# Patient Record
Sex: Female | Born: 1980 | Race: White | Hispanic: No | Marital: Married
Health system: Southern US, Community
[De-identification: ages and names within clinical notes are randomized; demographics above are authoritative.]

---

## 2001-08-16 ENCOUNTER — Other Ambulatory Visit: Admission: RE | Admit: 2001-08-16 | Discharge: 2001-08-16 | Payer: Self-pay | Admitting: Gynecology

## 2002-08-12 ENCOUNTER — Emergency Department (HOSPITAL_COMMUNITY): Admission: EM | Admit: 2002-08-12 | Discharge: 2002-08-12 | Payer: Self-pay | Admitting: Emergency Medicine

## 2002-08-12 ENCOUNTER — Encounter: Payer: Self-pay | Admitting: Emergency Medicine

## 2002-09-13 ENCOUNTER — Other Ambulatory Visit: Admission: RE | Admit: 2002-09-13 | Discharge: 2002-09-13 | Payer: Self-pay | Admitting: Gynecology

## 2004-10-21 ENCOUNTER — Other Ambulatory Visit: Admission: RE | Admit: 2004-10-21 | Discharge: 2004-10-21 | Payer: Self-pay | Admitting: Gynecology

## 2008-08-15 ENCOUNTER — Ambulatory Visit (HOSPITAL_COMMUNITY): Admission: RE | Admit: 2008-08-15 | Discharge: 2008-08-15 | Payer: Self-pay | Admitting: Obstetrics

## 2008-08-15 ENCOUNTER — Observation Stay (HOSPITAL_COMMUNITY): Admission: AD | Admit: 2008-08-15 | Discharge: 2008-08-15 | Payer: Self-pay | Admitting: Obstetrics

## 2008-08-15 ENCOUNTER — Encounter: Admission: RE | Admit: 2008-08-15 | Discharge: 2008-08-15 | Payer: Self-pay | Admitting: Obstetrics

## 2008-09-23 ENCOUNTER — Inpatient Hospital Stay (HOSPITAL_COMMUNITY): Admission: RE | Admit: 2008-09-23 | Discharge: 2008-09-27 | Payer: Self-pay | Admitting: Obstetrics

## 2008-09-24 ENCOUNTER — Encounter (INDEPENDENT_AMBULATORY_CARE_PROVIDER_SITE_OTHER): Payer: Self-pay | Admitting: Obstetrics

## 2010-10-14 LAB — CBC
HCT: 25.1 % — ABNORMAL LOW (ref 36.0–46.0)
HCT: 35.2 % — ABNORMAL LOW (ref 36.0–46.0)
Hemoglobin: 12 g/dL (ref 12.0–15.0)
Hemoglobin: 8.6 g/dL — ABNORMAL LOW (ref 12.0–15.0)
MCHC: 34.2 g/dL (ref 30.0–36.0)
MCV: 93.1 fL (ref 78.0–100.0)
Platelets: 270 10*3/uL (ref 150–400)
RBC: 2.66 MIL/uL — ABNORMAL LOW (ref 3.87–5.11)
RBC: 3.78 MIL/uL — ABNORMAL LOW (ref 3.87–5.11)
RDW: 13.8 % (ref 11.5–15.5)
WBC: 13.7 10*3/uL — ABNORMAL HIGH (ref 4.0–10.5)
WBC: 14.6 10*3/uL — ABNORMAL HIGH (ref 4.0–10.5)

## 2010-10-14 LAB — RPR: RPR Ser Ql: NONREACTIVE

## 2010-10-19 LAB — DIFFERENTIAL
Basophils Absolute: 0 10*3/uL (ref 0.0–0.1)
Basophils Relative: 0 % (ref 0–1)
Monocytes Relative: 7 % (ref 3–12)
Neutro Abs: 12.8 10*3/uL — ABNORMAL HIGH (ref 1.7–7.7)
Neutrophils Relative %: 77 % (ref 43–77)

## 2010-10-19 LAB — COMPREHENSIVE METABOLIC PANEL
Alkaline Phosphatase: 68 U/L (ref 39–117)
BUN: 5 mg/dL — ABNORMAL LOW (ref 6–23)
Calcium: 9 mg/dL (ref 8.4–10.5)
Creatinine, Ser: 0.51 mg/dL (ref 0.4–1.2)
Glucose, Bld: 105 mg/dL — ABNORMAL HIGH (ref 70–99)
Potassium: 3.7 mEq/L (ref 3.5–5.1)
Total Protein: 6.3 g/dL (ref 6.0–8.3)

## 2010-10-19 LAB — STREP B DNA PROBE: Strep Group B Ag: NEGATIVE

## 2010-10-19 LAB — CBC
HCT: 33.2 % — ABNORMAL LOW (ref 36.0–46.0)
Hemoglobin: 11.3 g/dL — ABNORMAL LOW (ref 12.0–15.0)
MCHC: 34.2 g/dL (ref 30.0–36.0)
MCV: 93.2 fL (ref 78.0–100.0)
RDW: 13 % (ref 11.5–15.5)

## 2010-11-16 NOTE — Op Note (Signed)
Mary Combs, Mary Combs                ACCOUNT NO.:  [id number]   MEDICAL RECORD NO.:  [id number]          PATIENT TYPE:  INP   LOCATION:  9127                          FACILITY:  [facility]   PHYSICIAN:  Lendon Colonel, MD   DATE OF BIRTH:  07-21-1980   DATE OF PROCEDURE:  09/24/2008  DATE OF DISCHARGE:                               OPERATIVE REPORT   PREOPERATIVE DIAGNOSIS:  Fetal bradycardia.   POSTOPERATIVE DIAGNOSIS:  Fetal bradycardia.   PROCEDURE:  Urgent primary low-transverse cesarean section.   SURGEON:  Lendon Colonel, MD   ASSISTANT:  Maxie Better, MD   ANESTHESIA:  Epidural.   FINDINGS:  Female infant in the direct LOA position, weight 10 pounds 2  ounces, Apgars 6 and 9, cord pH 7.32.  Normal uterus, normal tubes,  normal ovaries.   SPECIMENS:  Placenta to Pathology.   ANTIBIOTICS:  Ancef 2 g.   ESTIMATED BLOOD LOSS:  800 mL.   COMPLICATIONS:  None.   INDICATIONS:  This is a 30 year old G1 admitted the day prior to  cesarean section for induction of labor for LGA and maternal discomfort.  The patient was admitted with the cervix of 3 cm, 50% effaced,  Unfavorable Bishop, she received Pitocin for about 12 hours.  The  patient was rested overnight with Cytotec, and the patient had AROM and  resumption of Pitocin at 4 in the a.m. on September 24, 2008.  The patient  had slow progression to about 5 cm, achieved active labor, and had an  epidural.  The patient overall reactive testing when suddenly exhibited  an 8-minute fetal bradycardia to the 60s.  Terbutaline was given and as  the fetal heart beat did not improve, the patient was taken to the OR  for an urgent primary cesarean section.   PROCEDURE:  After informed consent was obtained from the patient, the  patient was taken to the operating room, epidural anesthesia was found  to be adequate.  She was prepped with Betadine, she was draped.  A  Pfannenstiel skin incision was made 2 cm above the pubic symphysis  with  the scalpel, carried through to the underlying layer of fascia with the  scalpel.  The fascia was incised with the scalpel extension bluntly.  Kocher clamps were placed on the inferior aspect of the fascial  incision, elevated up.  Rectus muscles dissected off with the Mayo  scissors.  The superior aspect of the fascial incision was grasped with  the Kocher clamps, elevated up, and the underlying rectus muscles  dissected off sharply with the Mayo scissors.  The peritoneum was  identified and entered bluntly after the rectus muscles were separated  in the midline bluntly.  The peritoneal incision was extended bluntly.  Bladder blade was inserted.  Vesicouterine peritoneum was identified,  grasped with pickups, and entered sharply with Metzenbaum scissors.  The  bladder flap was created digitally.  The bladder blade was reinserted.  The lower uterine segment was incised in a transverse fashion with the  scalpel and extended bluntly.  The infant's occiput was easily brought  to  the incision, presenting nuchal cord was noted, and pushed back into  the uterus.  Approximately 2 minutes were spent in delivering the  infant's occiput, due to large size, some difficulty was had.  Vacuum  was placed in the infant's occiput about 3 times; however, poor suction  was able to be obtained secondary to increased uterine blood.   Eventually, the infant's head was delivered with fundal pressure and  guidance from abdominal hand.  The remainder of the infant was delivered  without complication.  Cord was clamped and cut.  Infant's was handed  off to the pediatrician with good cry.  Cord gas was obtained.  The  placenta was expressed.  The uterus was exteriorized and cleared off all  clots and debris.  Pitocin was given for hemostasis.  Uterine incision  was repaired with 0-Vicryl in a running locked fashion and a second  layer of same suture was used in the imbricating fashion to obtain good   hemostasis.  Good tone was noted.  Good hemostasis noted.  The uterus  was returned to the abdomen.  The gutters were cleared of all clots and  debris.  The pelvis was irrigated with warm normal saline.  The uterine  incisions were re-inspected and found to be hemostatic.  The peritoneum  was closed with 2-0 Vicryl in a running fashion.  The muscle edges and  underside of fascia were inspected, found to be hemostatic.  The fascia  was closed with 0-Vicryl in a running fashion in a single layer after  single suture of 2-0 Vicryl was used to reapproximate the rectus muscles  in the midline.  The subcutaneous tissue was closed with plain gut.  Several small capillary bleeders were Bovie cauterized for hemostasis,  and the skin was closed with staples.  The patient tolerated the  procedure well.  Sponge, lap, and needle counts were correct x3, and the  patient was taken to the recovery room in a stable condition.      Lendon Colonel, MD  Electronically Signed     KAF/MEDQ  D:  09/24/2008  T:  09/25/2008  Job:  [id number]

## 2010-11-19 NOTE — Discharge Summary (Signed)
Mary Combs, Mary Combs                ACCOUNT NO.:  [id number]   MEDICAL RECORD NO.:  [id number]          PATIENT TYPE:  INP   LOCATION:  9127                          FACILITY:  [facility]   PHYSICIAN:  Genia Del, M.D.DATE OF BIRTH:  September 03, 1980   DATE OF ADMISSION:  09/23/2008  DATE OF DISCHARGE:  09/27/2008                               DISCHARGE SUMMARY   DIAGNOSES:  39 weeks and 5 days gestation, large for gestational age,  estimated over 3900 grams, maternal hydronephrosis.   DISCHARGE DIAGNOSES:  On postoperative day #3, status post cesarean  section of a female newborn, acute blood loss anemia.   PATIENT HISTORY:  The patient is a gravida 1, para 0, at 66 weeks and  5/7.  Prenatal care has been at [facility] with the  primary of Dr. Ernestina Penna since 7 weeks' gestation.   PRENATAL LABORATORY STUDY:  The patient is A positive, antibody screen  negative, HIV negative, RPR is negative, hepatitis B is negative, GTT  within normal limits, and group beta strep negative.  Prenatal course  has been complicated by maternal hydronephrosis, hypothyroidism, and  size greater than dates consistent with LGA placenta.   MEDICAL HISTORY:  The patient has migraines, hypothyroidism.   SURGICAL HISTORY:  Bowel resection as an infant.   FAMILY HISTORY:  Myocardial infarction and diabetes.   ALLERGIES:  The patient has no known drug allergies.   CURRENT MEDICATIONS ON DAY OF ADMISSION:  Prenatal vitamin once daily  and Synthroid 50 mcg daily.   The patient presents for induction of labor for LGA with estimated fetal  weight of over 3900 g.  The patient underwent Cytotec cervical ripening  followed by Pitocin low-dose protocol.  Complications of labor  prolonged, fetal bradycardia on March 24 resulting in a cesarean section  with Dr. Ernestina Penna with a female newborn.   POSTOPERATIVE COURSE:  CBC postoperatively; white blood cell count 14.6,  hemoglobin 8.6, hematocrit 25.1, and  platelet count of 190.  The patient  was started on Niferex 150 mg one daily and Colace 100 mg one p.o. daily  for acute blood loss anemia.  The patient was discharged on day 3 with a  normal physical exam.  Incision is healing well and well-approximated  staples were removed and Steri-Strips were applied.  Vital signs on day  of discharge, 97.6.  Pulse 90, respirations 18, blood pressure 102/65.  The patient has remained afebrile during postoperative course.   DISCHARGE AT INSTRUCTIONS:  Per [facility] discharge booklet for  postoperative care.   MEDICATIONS AT DISCHARGE:  1. Prenatal vitamin once daily.  2. Colace 100 mg 1-2 p.o. daily p.r.n., avoid constipation.  3. Synthroid 50 mcg 1 p.o. daily.  4. Niferex 150 mg 1 p.o. daily.  5. Ibuprofen over-the-counter 2-4 tablets every 8 hours as needed for      discomfort.  6. Percocet 1 tablet p.o. q.4-6 h. as needed for pain.   The patient is to follow up at [facility] in 6 weeks.       Marlinda Mike, C.N.M.  Genia Del, M.D.  Electronically Signed    TB/MEDQ  D:  09/28/2008  T:  09/28/2008  Job:  [id number]

## 2011-06-01 ENCOUNTER — Other Ambulatory Visit: Payer: Self-pay | Admitting: Family Medicine

## 2011-06-01 DIAGNOSIS — R1032 Left lower quadrant pain: Secondary | ICD-10-CM

## 2011-06-03 ENCOUNTER — Other Ambulatory Visit: Payer: Self-pay

## 2011-08-02 ENCOUNTER — Inpatient Hospital Stay: Admission: RE | Admit: 2011-08-02 | Payer: Self-pay | Source: Ambulatory Visit

## 2016-03-23 ENCOUNTER — Ambulatory Visit: Payer: Self-pay | Admitting: Family Medicine

## 2016-08-01 ENCOUNTER — Ambulatory Visit: Payer: Self-pay | Admitting: Family Medicine

## 2016-12-01 ENCOUNTER — Telehealth: Payer: Self-pay | Admitting: *Deleted

## 2016-12-01 NOTE — Telephone Encounter (Signed)
Appt has been cancelled and will not be rescheduled at this time per KT.

## 2016-12-01 NOTE — Telephone Encounter (Signed)
Patient called office to r/s appt.  I advised her we are unable to reschedule her appt due to multiple cancellations.  Patient asked for my name and states she will be filing a formal complaint with [facility] regarding this matter.

## 2016-12-01 NOTE — Telephone Encounter (Signed)
-----   Message from Sheliah HatchKatherine E Tabori, MD sent at 12/01/2016 12:15 PM EDT ----- Regarding: RE: New patient appt Please do not reschedule pt.  3 strikes- you're out (of luck!)  KT ----- Message ----- From: Donzetta MattersJeffries, Angela H Sent: 12/01/2016  11:49 AM To: Sheliah HatchKatherine E Tabori, MD Subject: New patient appt                               Pt LMOVM stating that she will need to reschedule her NP appt for 12/02/16. When looking in her chart, this will make her 3rd NP rescheduled appt. Please advise if I should reschedule this again.

## 2016-12-01 NOTE — Telephone Encounter (Signed)
I am forwarding this note to management as there is no reason to file a complaint against you as you were simply relaying a message from me and enforcing our office policy.  She was given 3 chances to establish and often, people only get 1 opportunity.

## 2016-12-02 ENCOUNTER — Ambulatory Visit: Payer: Self-pay | Admitting: Family Medicine

## 2019-09-24 ENCOUNTER — Other Ambulatory Visit: Payer: Self-pay | Admitting: Obstetrics

## 2019-09-24 DIAGNOSIS — N6310 Unspecified lump in the right breast, unspecified quadrant: Secondary | ICD-10-CM

## 2019-09-24 DIAGNOSIS — R928 Other abnormal and inconclusive findings on diagnostic imaging of breast: Secondary | ICD-10-CM

## 2019-09-25 ENCOUNTER — Other Ambulatory Visit: Payer: Self-pay

## 2019-09-25 ENCOUNTER — Other Ambulatory Visit: Payer: Self-pay | Admitting: Obstetrics

## 2019-09-25 ENCOUNTER — Ambulatory Visit
Admission: RE | Admit: 2019-09-25 | Discharge: 2019-09-25 | Disposition: A | Discharge status: Home / Self Care | Payer: 59 | Source: Ambulatory Visit | Attending: Obstetrics | Admitting: Obstetrics

## 2019-09-25 DIAGNOSIS — N6310 Unspecified lump in the right breast, unspecified quadrant: Secondary | ICD-10-CM

## 2019-09-25 DIAGNOSIS — R928 Other abnormal and inconclusive findings on diagnostic imaging of breast: Secondary | ICD-10-CM

## 2019-10-02 ENCOUNTER — Other Ambulatory Visit: Payer: Self-pay

## 2019-10-02 ENCOUNTER — Ambulatory Visit
Admission: RE | Admit: 2019-10-02 | Discharge: 2019-10-02 | Disposition: A | Discharge status: Home / Self Care | Payer: 59 | Source: Ambulatory Visit | Attending: Obstetrics | Admitting: Obstetrics

## 2019-10-02 DIAGNOSIS — N6310 Unspecified lump in the right breast, unspecified quadrant: Secondary | ICD-10-CM

## 2021-10-06 IMAGING — US US BREAST*R* LIMITED INC AXILLA
1 series · 13 of 17 positions shown · non-contrast
Comparison: Previous baseline screening mammogram 09/24/2019.

CLINICAL DATA: Recall from screening to evaluate 2-3 possible small
masses over the right periareolar region.

EXAM:
DIGITAL DIAGNOSTIC right MAMMOGRAM WITH TOMO
ULTRASOUND right BREAST

[Series 1: us breast*right* limited inc axilla · 0.06mm/px · 13 of 17 slices shown]
[im 1/17]
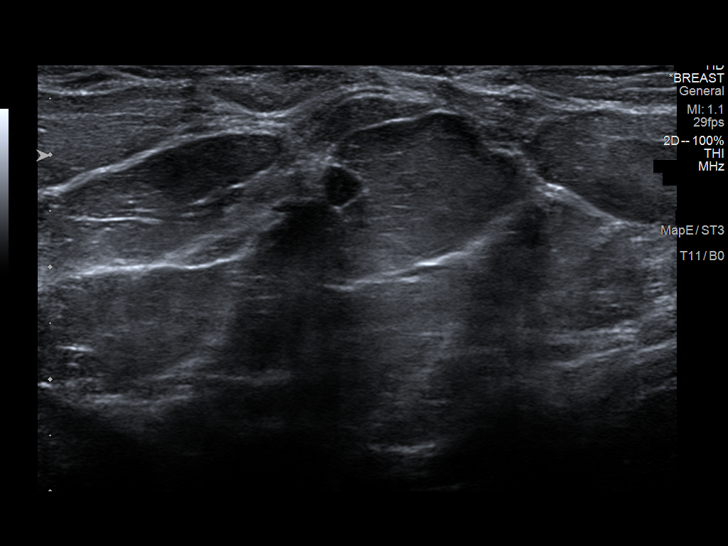
[im 2/17]
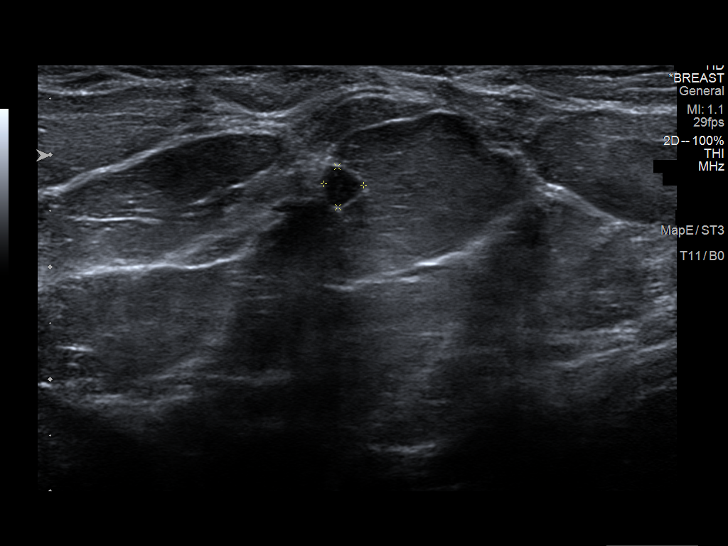
[im 4/17]
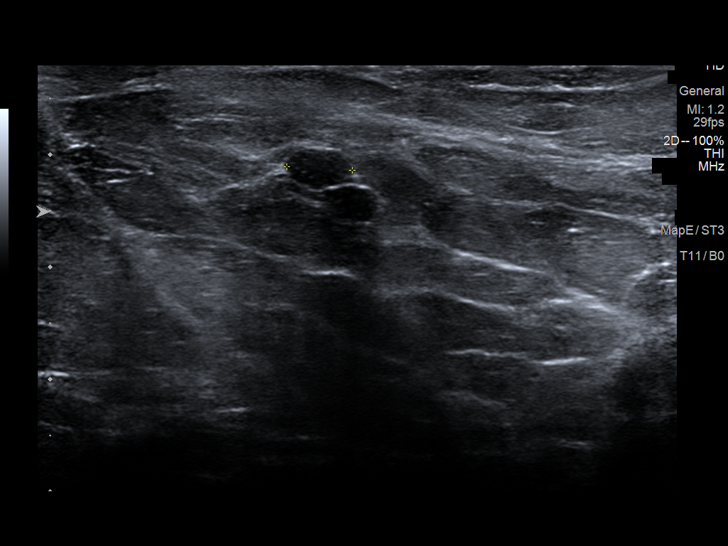
[im 5/17]
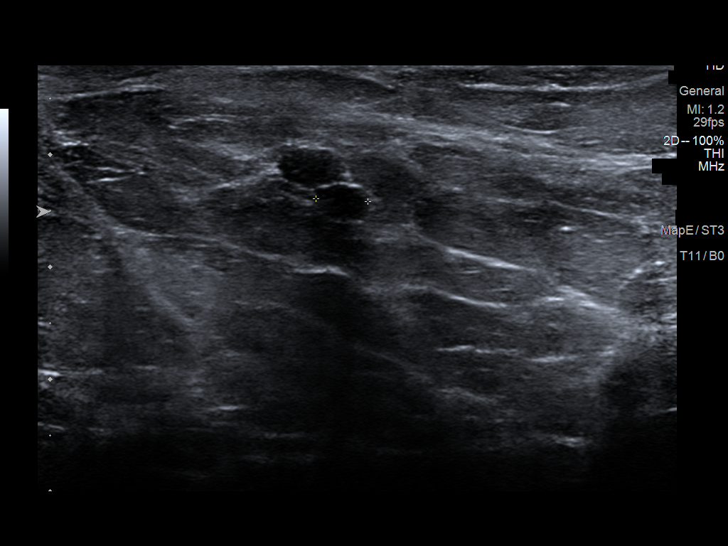
[im 6/17]
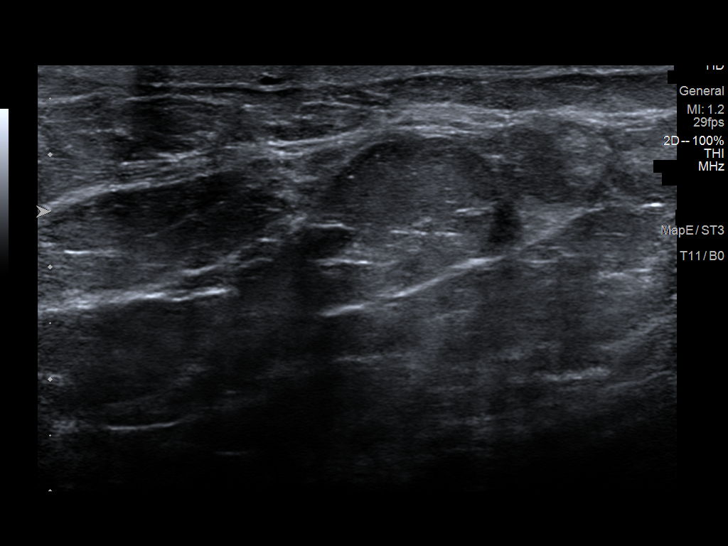
[im 8/17]
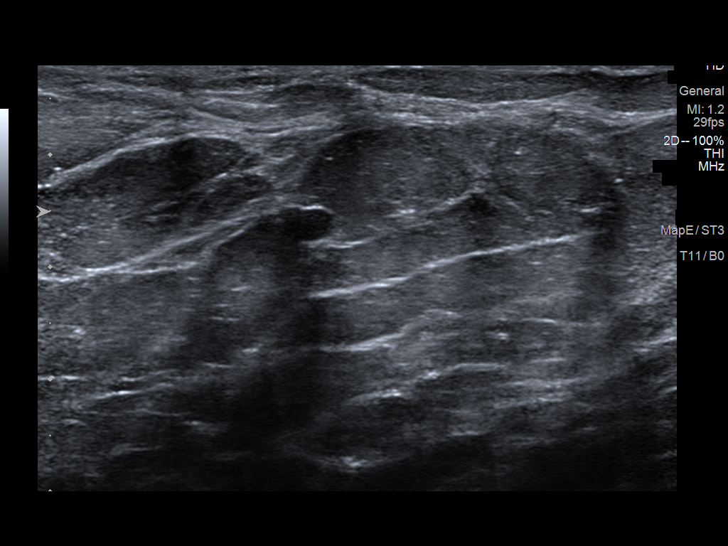
[im 9/17]
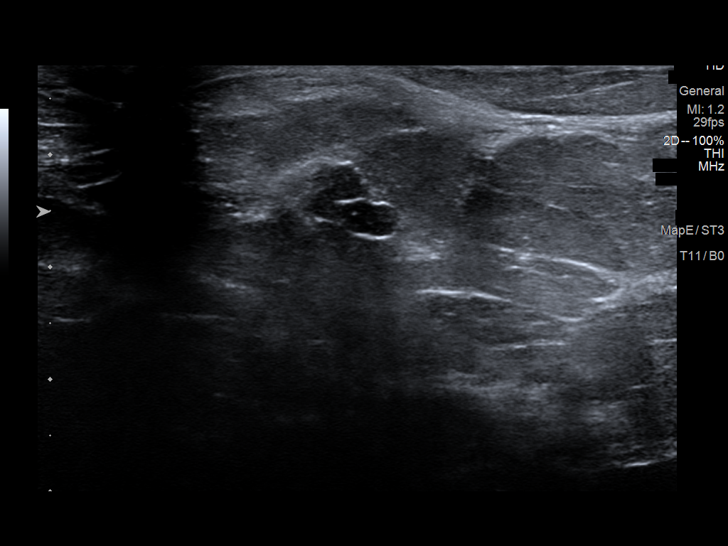
[im 10/17]
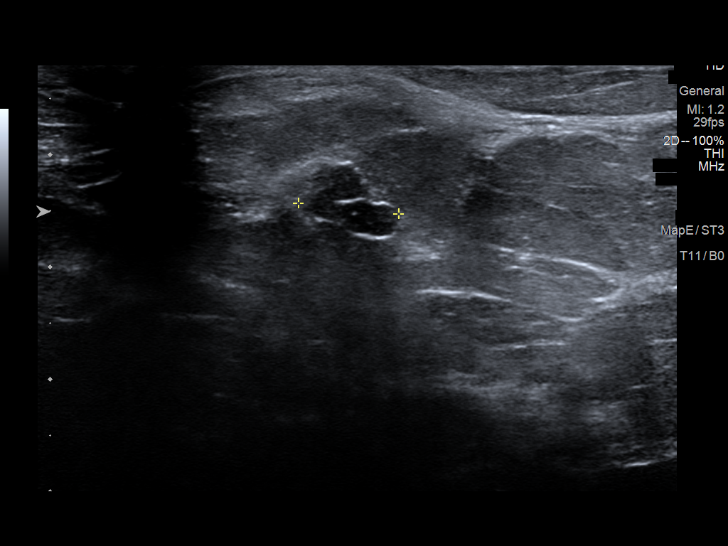
[im 12/17]
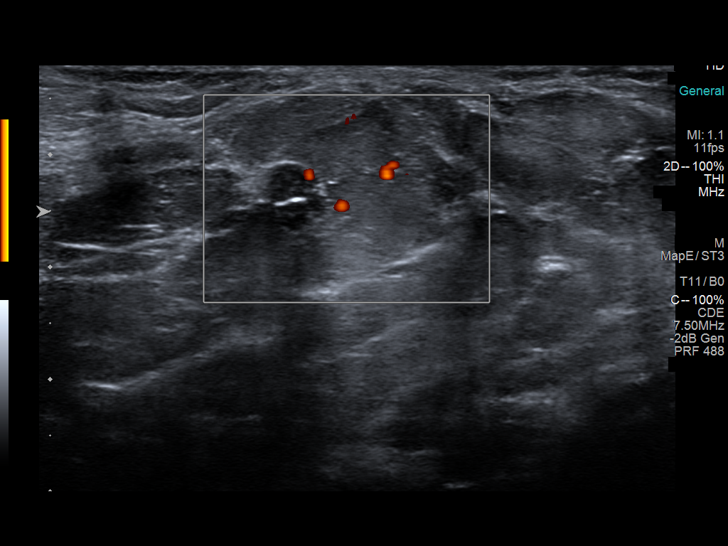
[im 13/17]
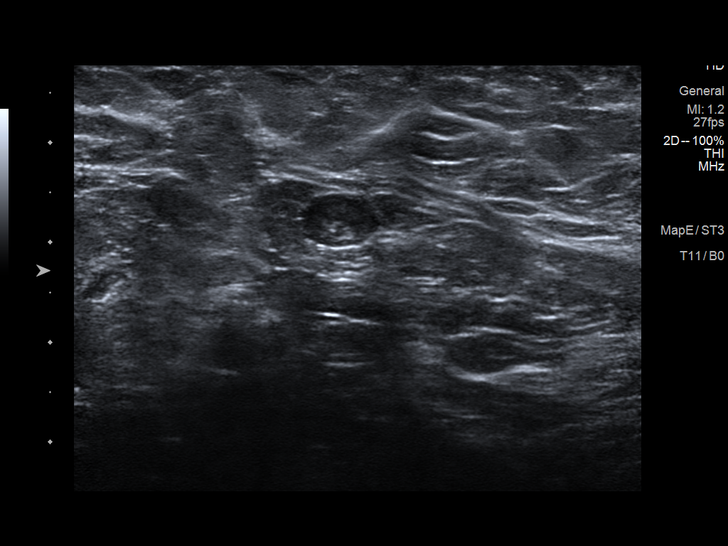
[im 14/17]
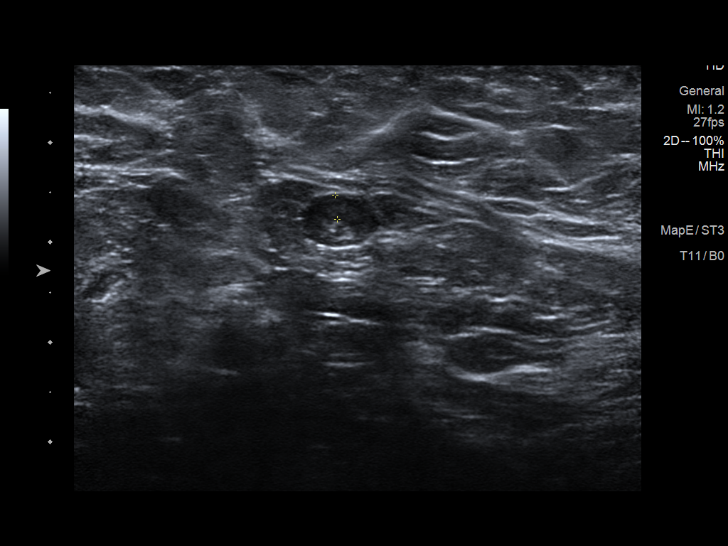
[im 16/17]
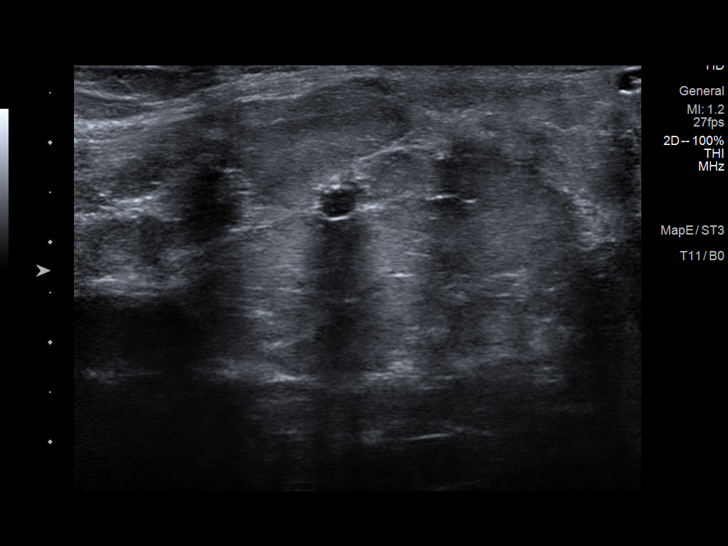
[im 17/17]
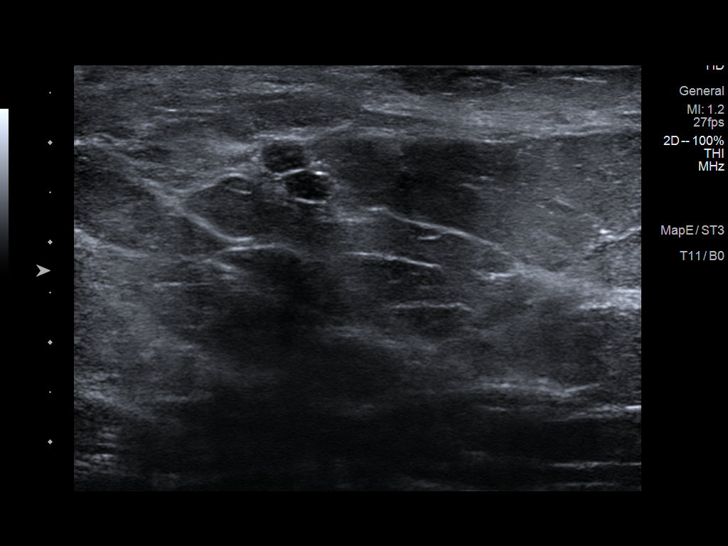

[13 of 17 positions shown; findings below may reference images not displayed]

ACR Breast Density Category b: There are scattered areas of
fibroglandular density.
FINDINGS: Additional spot compression tomographic images demonstrate 2
immediately adjacent subcentimeter oval circumscribed masses versus
a single bilobed circumscribed mass over the anterior third of the
slightly inner mid to upper right periareolar region.

Targeted ultrasound is performed, showing 2 immediately adjacent
versus single bilobed oval circumscribed hypoechoic mass at the 2
o'clock position of the right breast 1 cm from the nipple. These
measure 4 x 4 x 6 mm and 3 x 5 x 5 mm respectively. These measure 9
mm when measured together. There is minimal vascular flow over the
more superficial mass. These likely represent a benign process and
may be due to papilloma, fibroadenoma or apocrine metaplasia.

Ultrasound the right axilla is normal.
IMPRESSION: Two adjacent versus single bilobed probable benign mass at the 2
o'clock position of the right breast 1 cm from the nipple measuring
9 mm altogether. No abnormal right axillary lymph nodes.

RECOMMENDATION:
Options of six-month follow-up diagnostic imaging versus ultrasound
guided core needle biopsy were discussed as patient wishes to
proceed with ultrasound core needle biopsy.

I have discussed the findings and recommendations with the patient.
If applicable, a reminder letter will be sent to the patient
regarding the next appointment.

BI-RADS CATEGORY  3: Probably benign.

Biopsy will be scheduled here at the [REDACTED]
prior to patient's departure.

## 2021-10-06 IMAGING — MG MM DIGITAL DIAGNOSTIC UNILAT*R* W/ TOMO W/ CAD
6 series · 6 of 18 positions shown · non-contrast
Comparison: Previous baseline screening mammogram 09/24/2019.

CLINICAL DATA: Recall from screening to evaluate 2-3 possible small
masses over the right periareolar region.

EXAM:
DIGITAL DIAGNOSTIC right MAMMOGRAM WITH TOMO
ULTRASOUND right BREAST

[R MLO synth-2D]
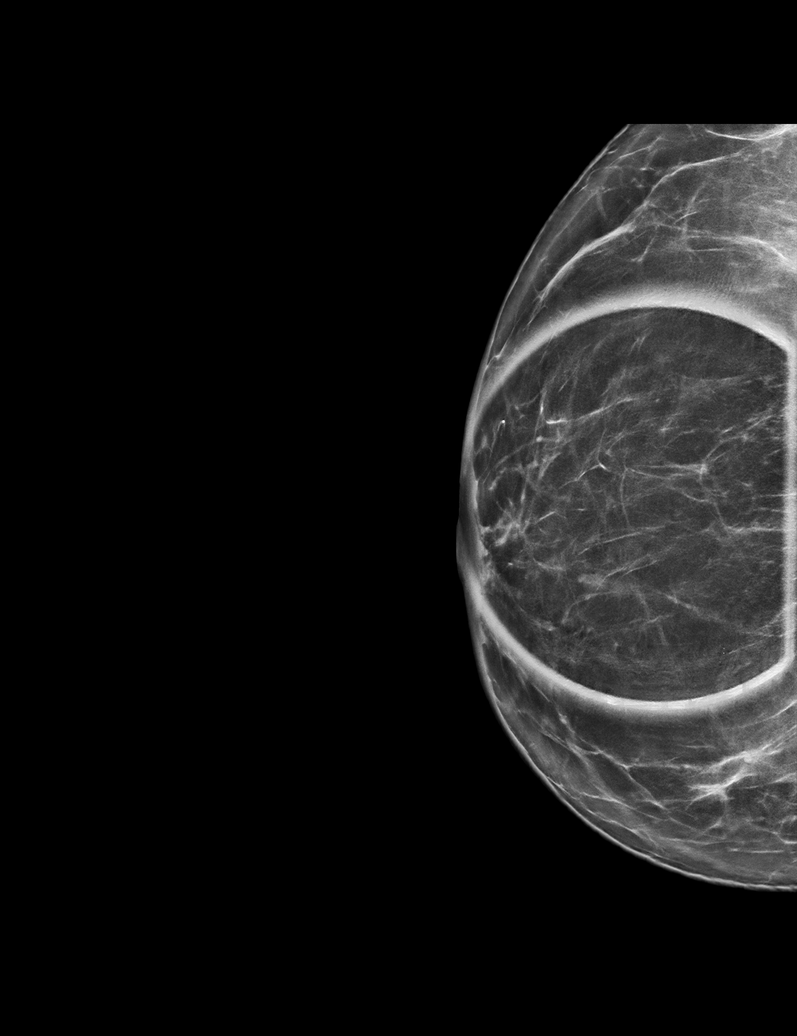

[R CC synth-2D (1 of 2)]
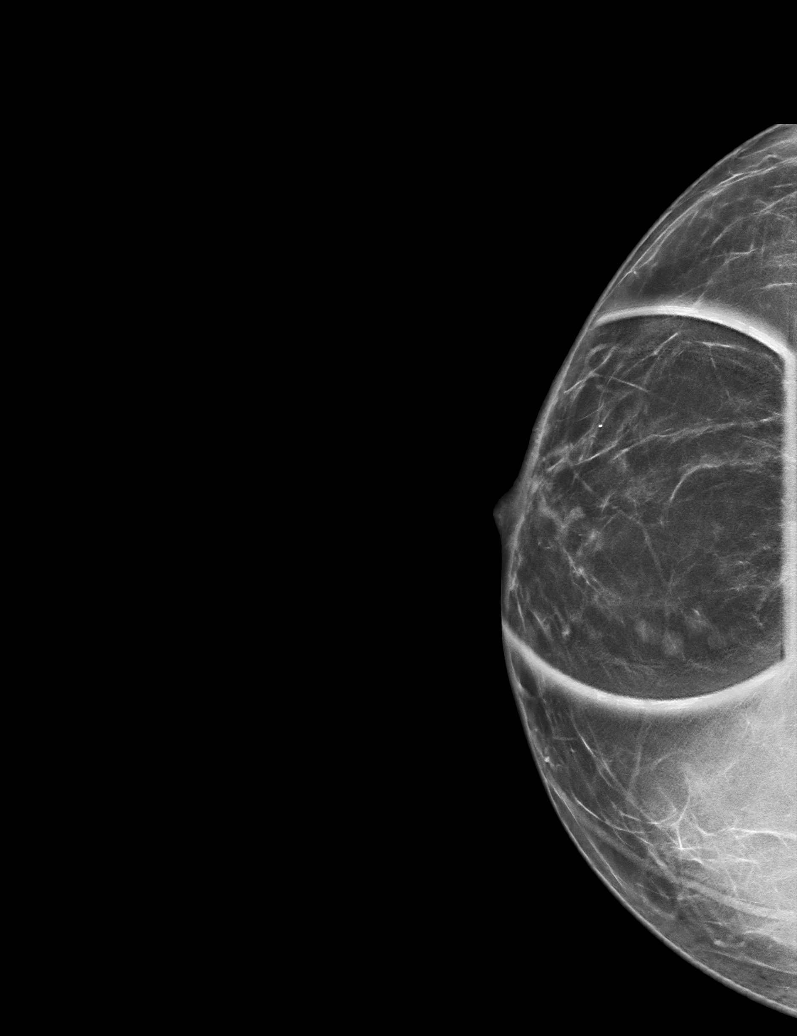

[R CC synth-2D (2 of 2)]
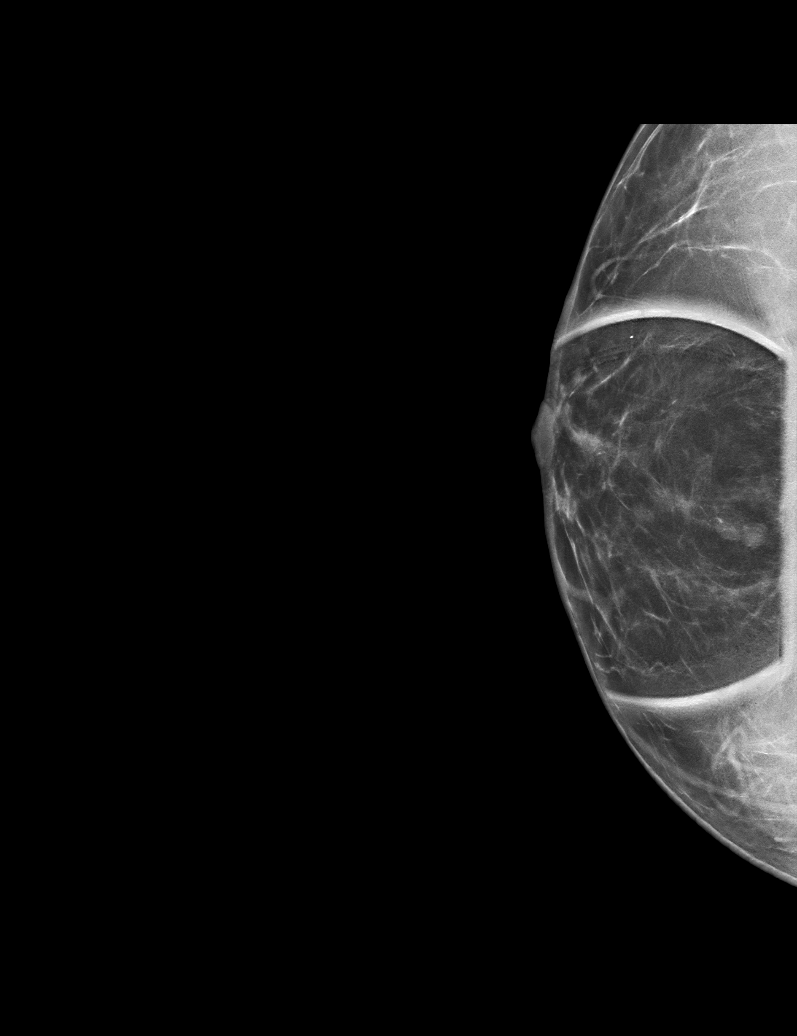

[R CC tomo (1 of 2) · tomo slice 29/57.0]
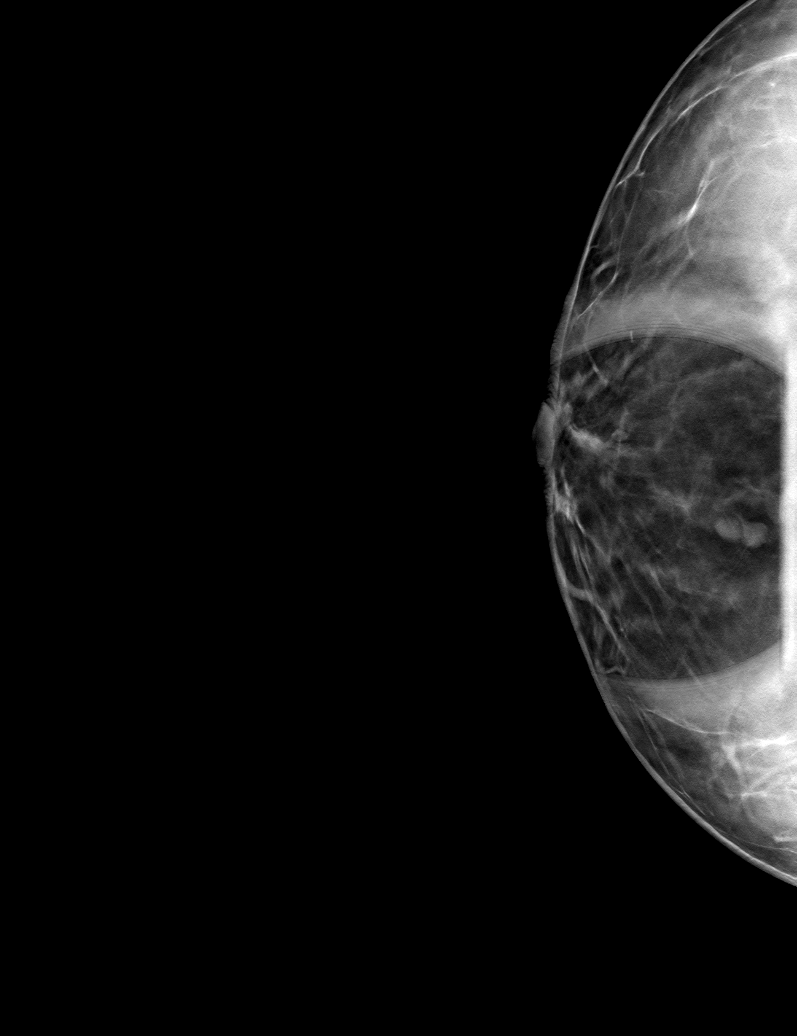

[R MLO tomo · tomo slice 39/78.0]
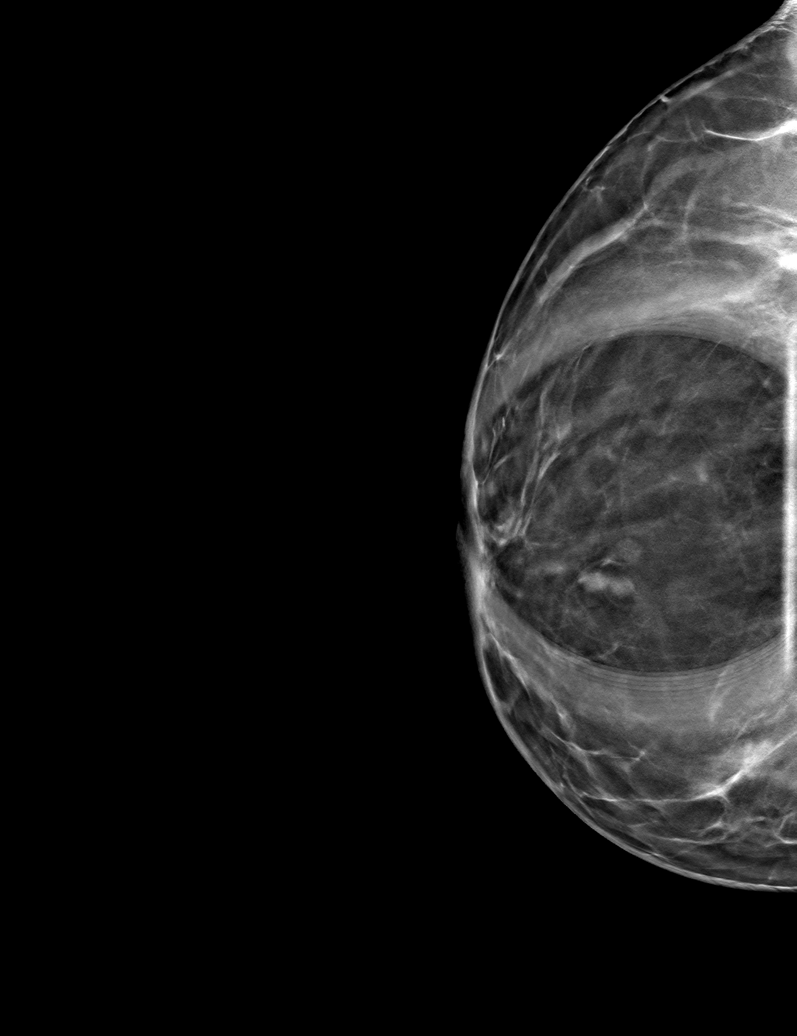

[R CC tomo (2 of 2) · tomo slice 33/64.0]
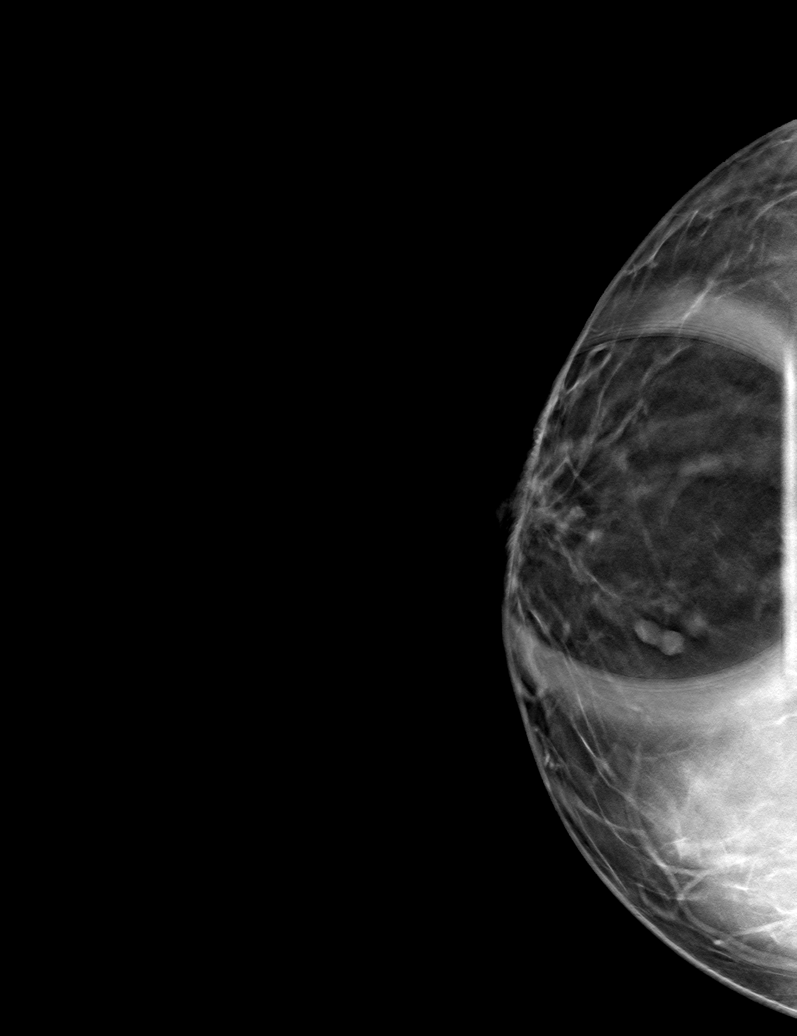

[6 of 18 positions shown; findings below may reference images not displayed]

ACR Breast Density Category b: There are scattered areas of
fibroglandular density.
FINDINGS: Additional spot compression tomographic images demonstrate 2
immediately adjacent subcentimeter oval circumscribed masses versus
a single bilobed circumscribed mass over the anterior third of the
slightly inner mid to upper right periareolar region.

Targeted ultrasound is performed, showing 2 immediately adjacent
versus single bilobed oval circumscribed hypoechoic mass at the 2
o'clock position of the right breast 1 cm from the nipple. These
measure 4 x 4 x 6 mm and 3 x 5 x 5 mm respectively. These measure 9
mm when measured together. There is minimal vascular flow over the
more superficial mass. These likely represent a benign process and
may be due to papilloma, fibroadenoma or apocrine metaplasia.

Ultrasound the right axilla is normal.
IMPRESSION: Two adjacent versus single bilobed probable benign mass at the 2
o'clock position of the right breast 1 cm from the nipple measuring
9 mm altogether. No abnormal right axillary lymph nodes.

RECOMMENDATION:
Options of six-month follow-up diagnostic imaging versus ultrasound
guided core needle biopsy were discussed as patient wishes to
proceed with ultrasound core needle biopsy.

I have discussed the findings and recommendations with the patient.
If applicable, a reminder letter will be sent to the patient
regarding the next appointment.

BI-RADS CATEGORY  3: Probably benign.

Biopsy will be scheduled here at the [REDACTED]
prior to patient's departure.

## 2021-10-13 IMAGING — MG MM BREAST LOCALIZATION CLIP
4 series · 4 of 12 positions shown · non-contrast
Comparison: Previous exam(s).

CLINICAL DATA: 38-year-old female presenting for biopsy of a right
breast mass.

EXAM:
DIAGNOSTIC RIGHT MAMMOGRAM POST ULTRASOUND BIOPSY

[R ML synth-2D]
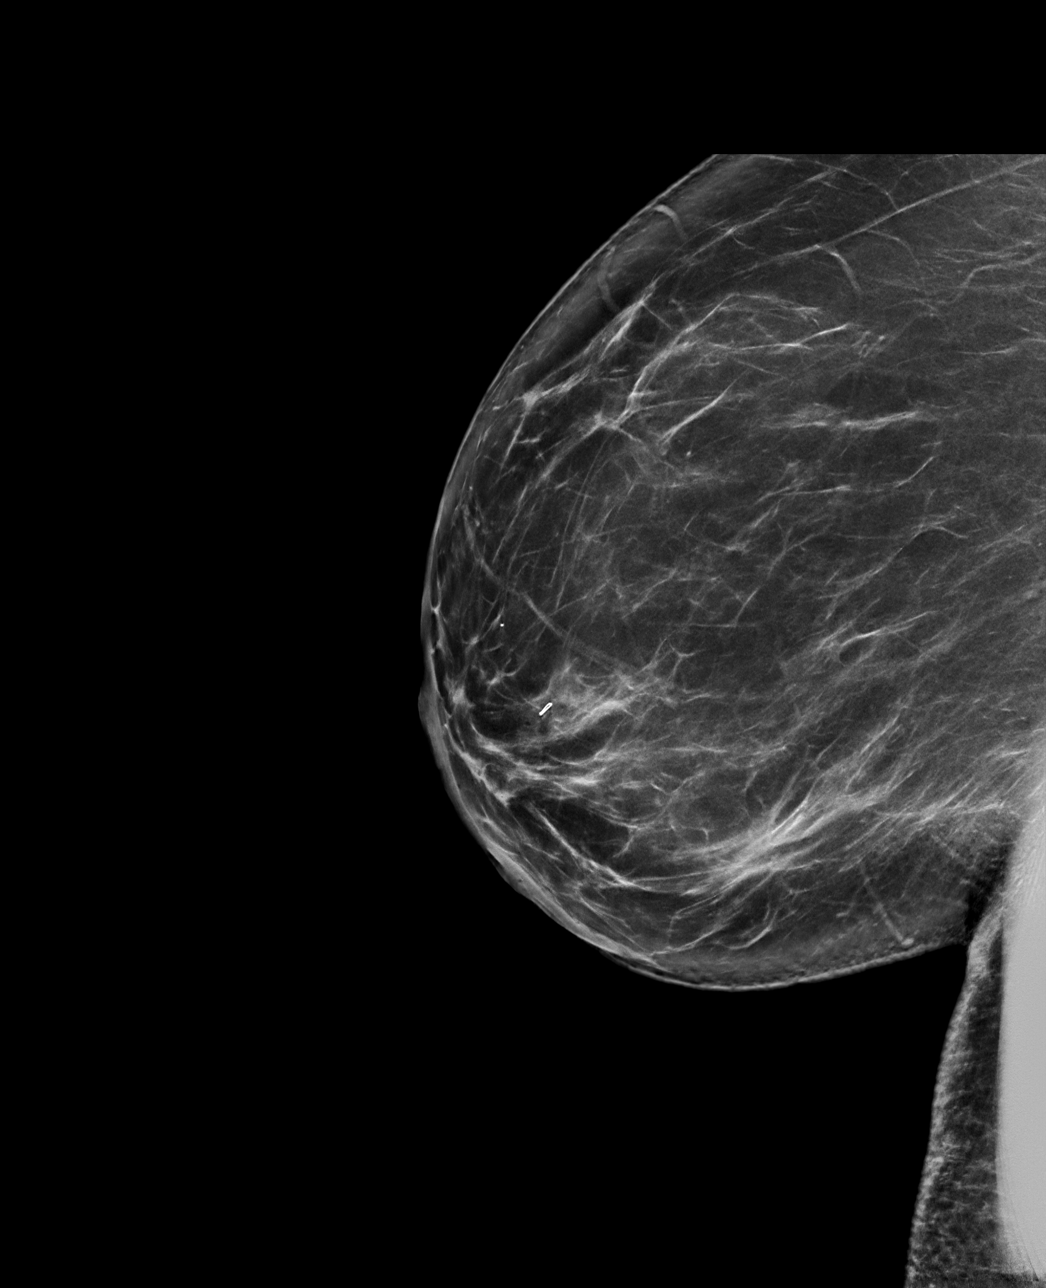

[R CC synth-2D]
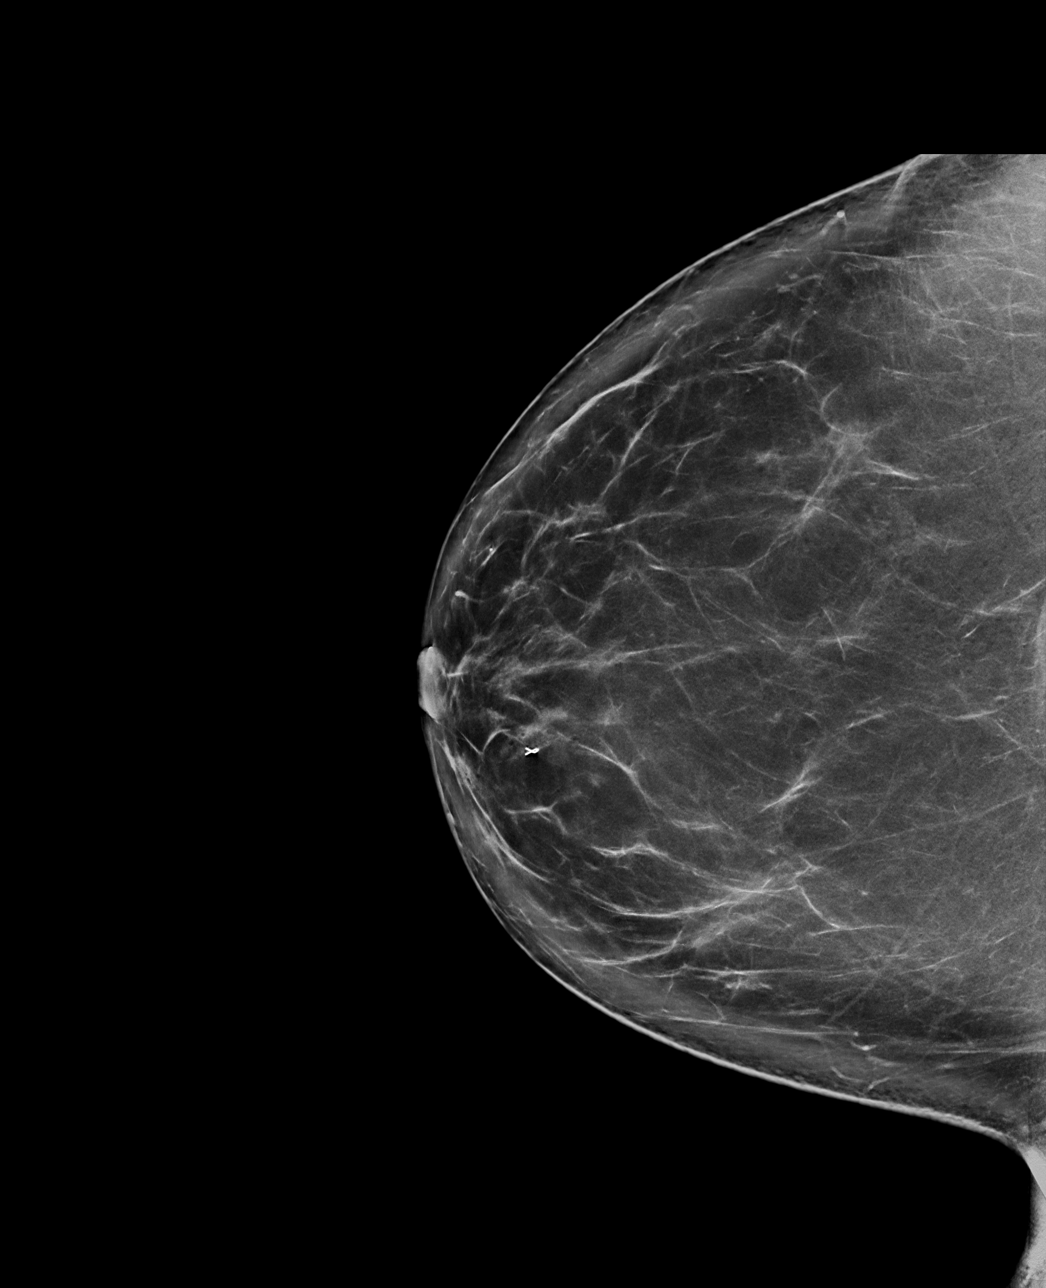

[R ML tomo · tomo slice 47/93.0]
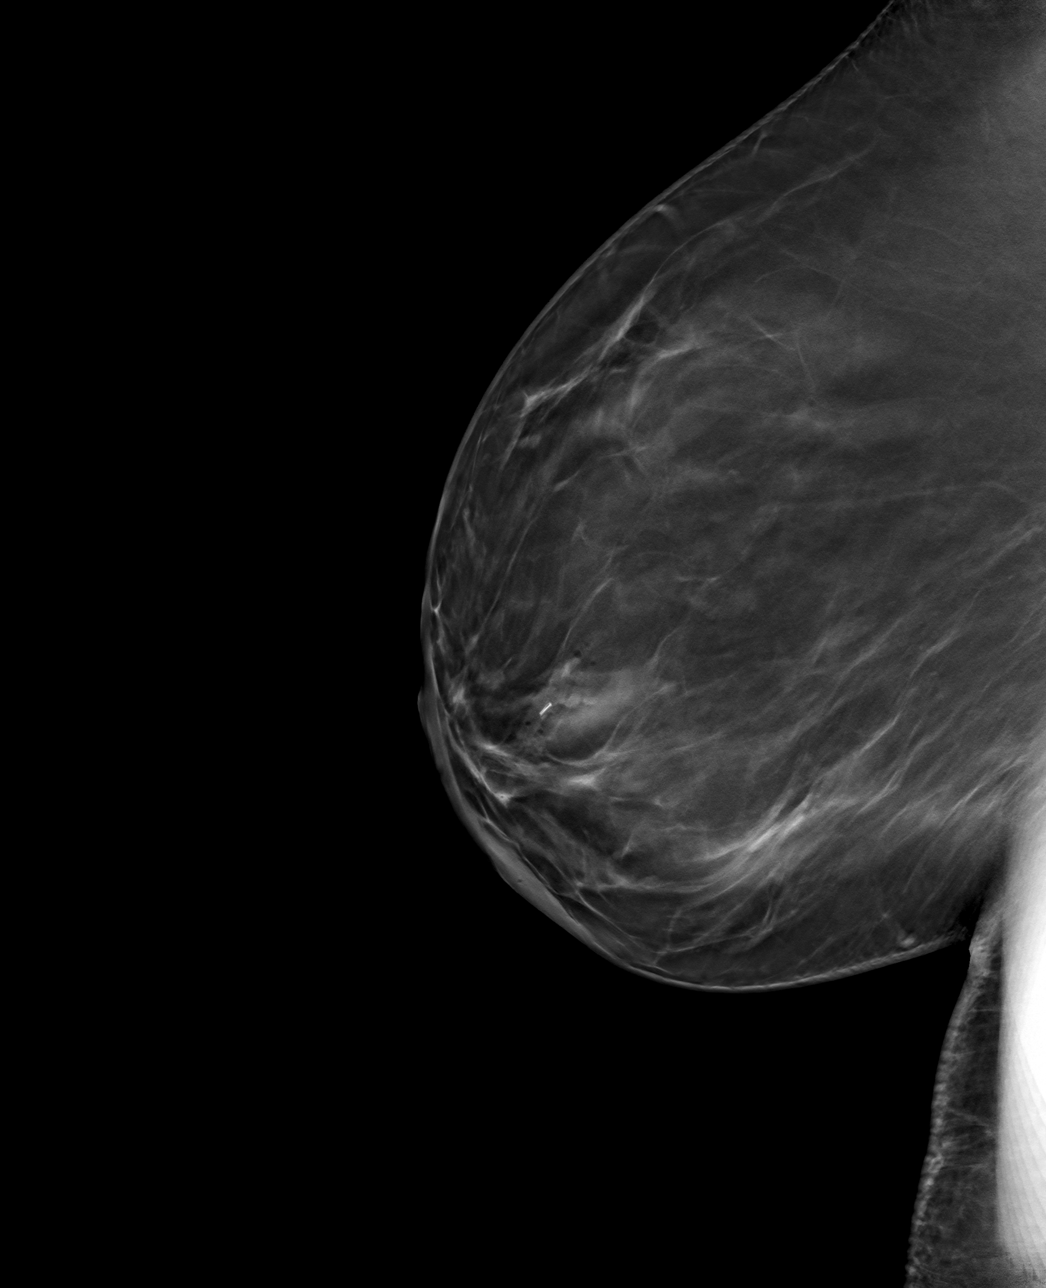

[R CC tomo · tomo slice 51/100.0]
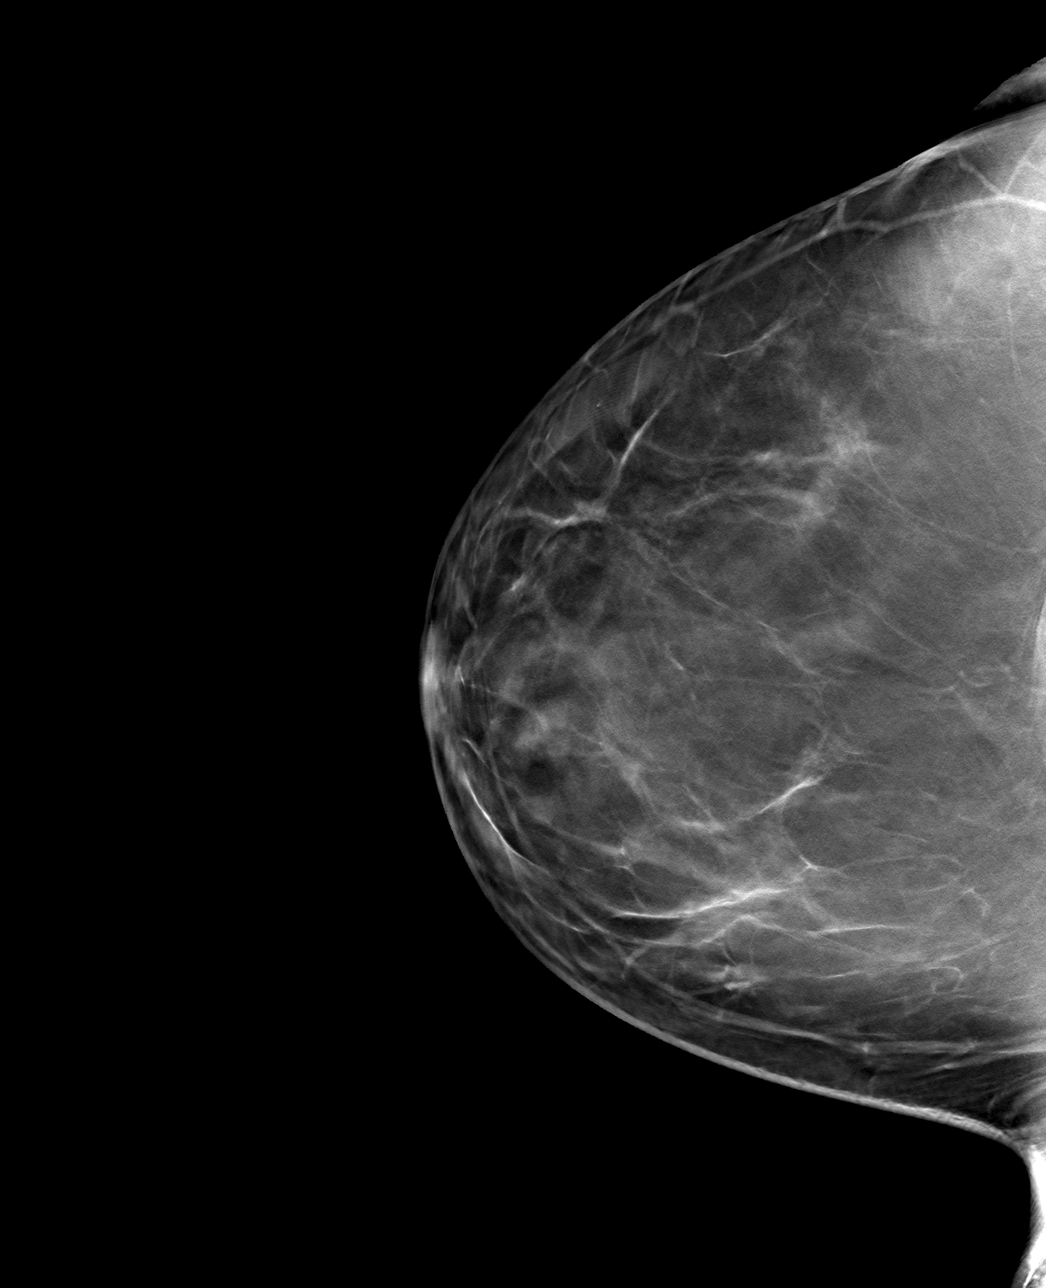

[4 of 12 positions shown; findings below may reference images not displayed]

FINDINGS: Mammographic images were obtained following ultrasound guided biopsy
of a right breast mass at 2 o'clock. The ribbon biopsy marking clip
is in expected position at the site of biopsy.
IMPRESSION: Appropriate positioning of the ribbon shaped biopsy marking clip at
the site of biopsy in the right breast at 2 o'clock.

Final Assessment: Post Procedure Mammograms for Marker Placement

## 2021-10-13 IMAGING — MG US  BREAST BX W/ LOC DEV 1ST LESION IMG BX SPEC US GUIDE*R*
1 series · 8 of 8 positions shown · non-contrast
Comparison: Previous exam(s).
COMPARISON: Previous exam(s).

Addendum:
CLINICAL DATA: 38-year-old female presenting for biopsy of a right
breast mass.

EXAM:
ULTRASOUND GUIDED RIGHT BREAST CORE NEEDLE BIOPSY

[Series 1: MG view · 0.06mm/px · 8 of 14 slices shown]
[im 1/14]
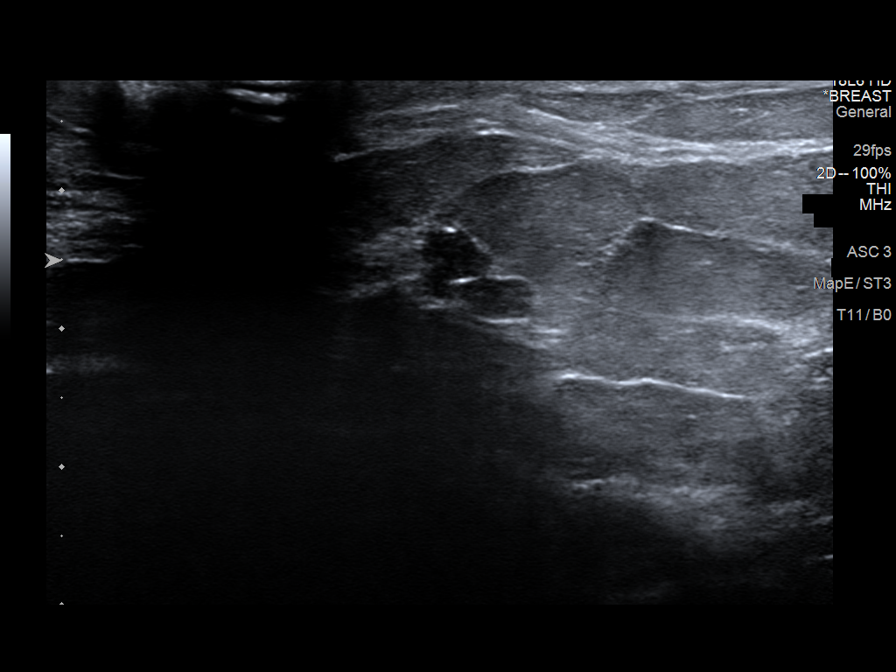
[im 2/14]
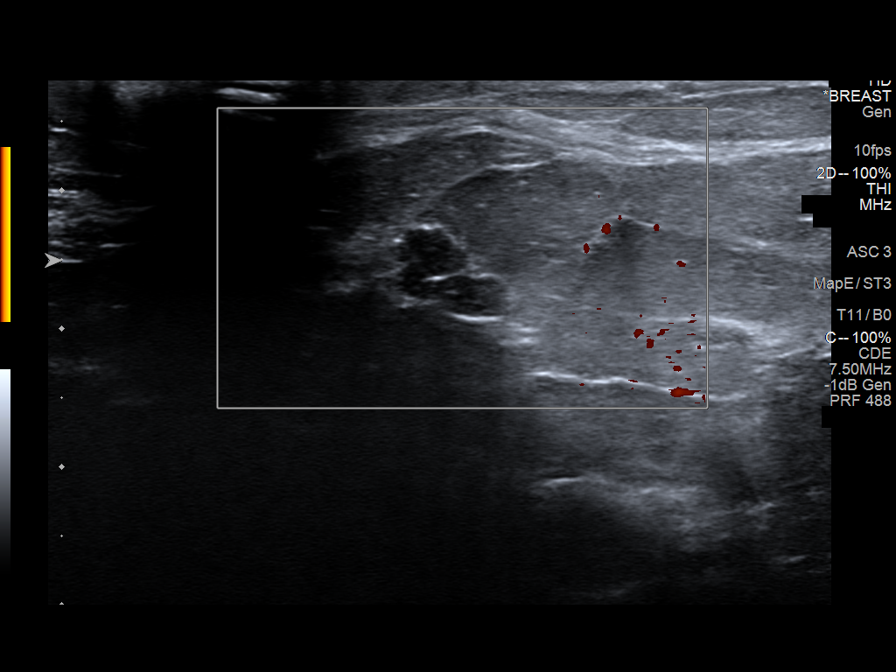
[im 4/14]
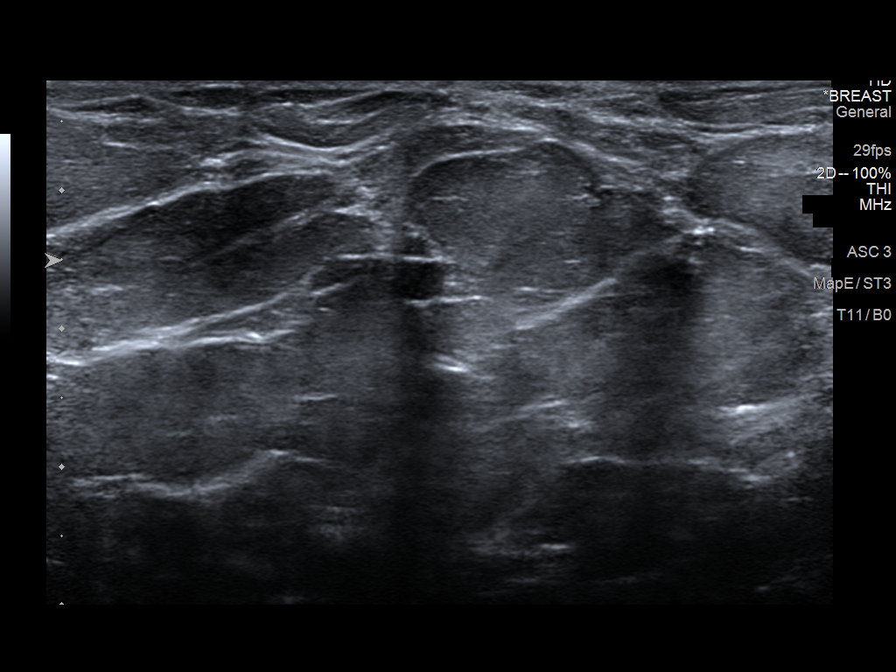
[im 6/14]
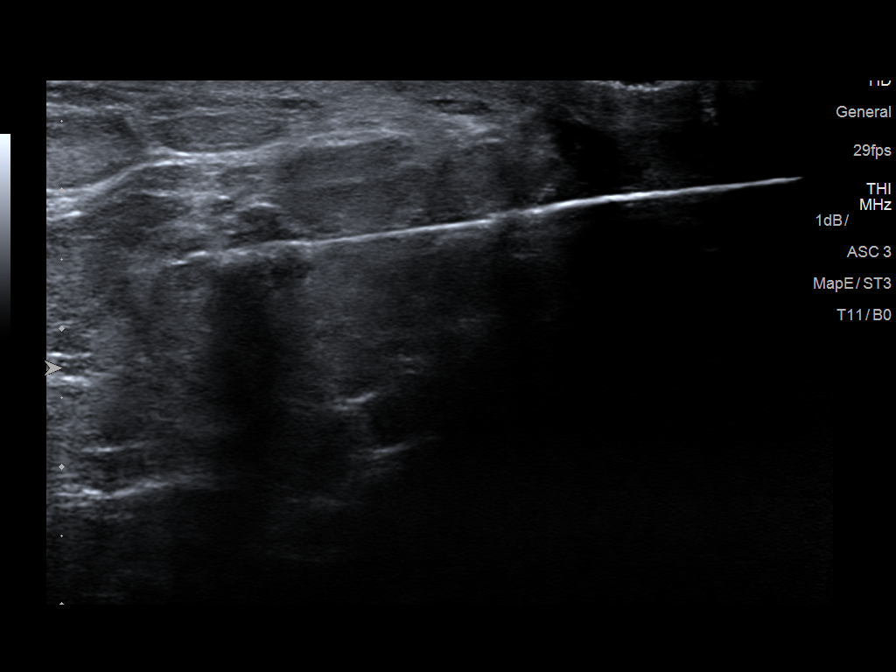
[im 8/14]
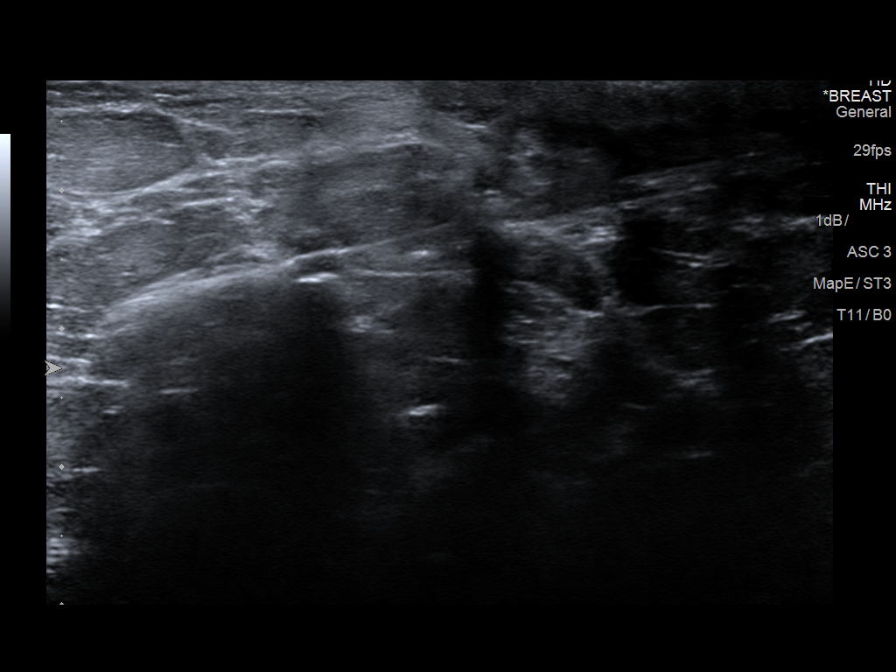
[im 10/14]
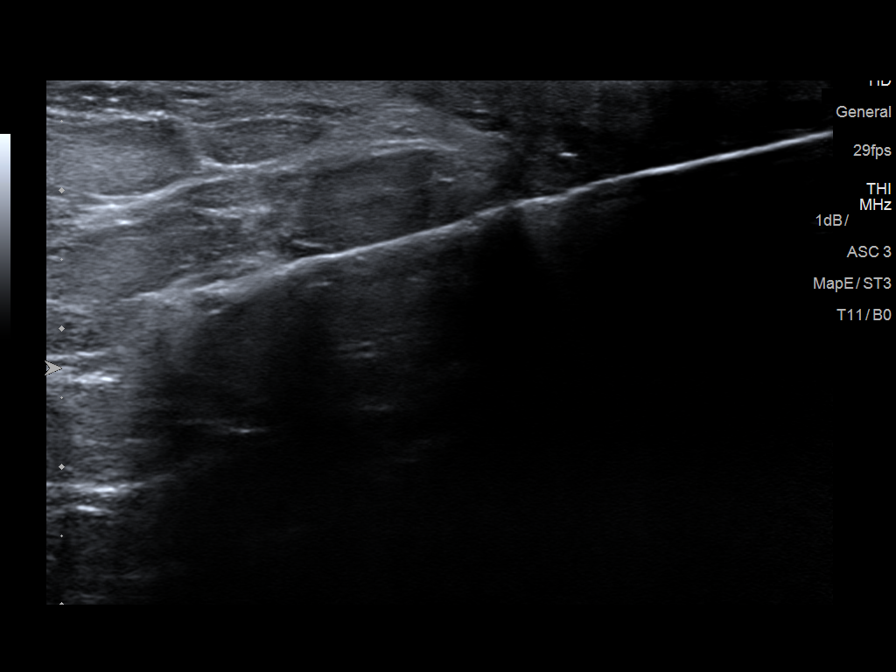
[im 12/14]
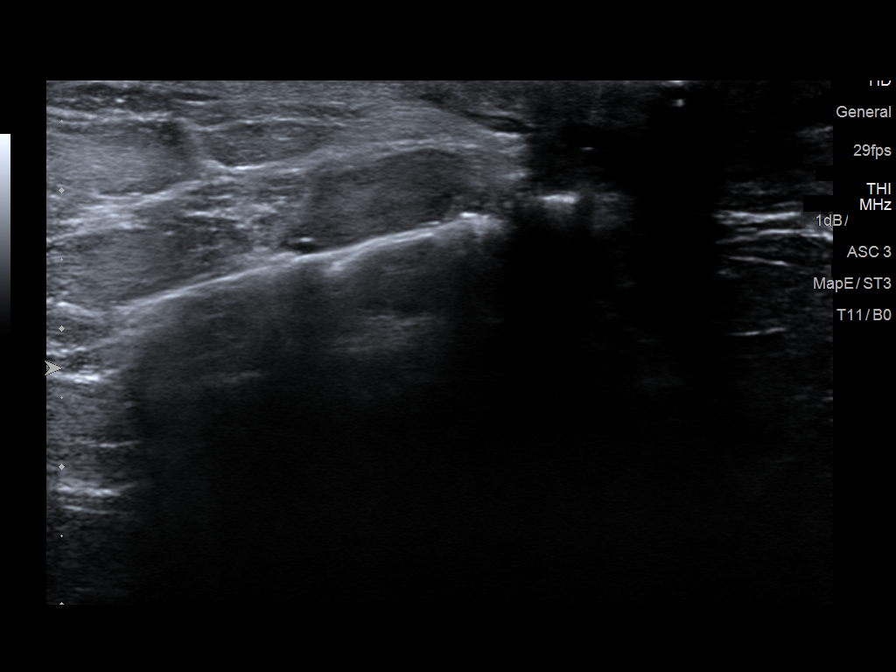
[im 14/14]
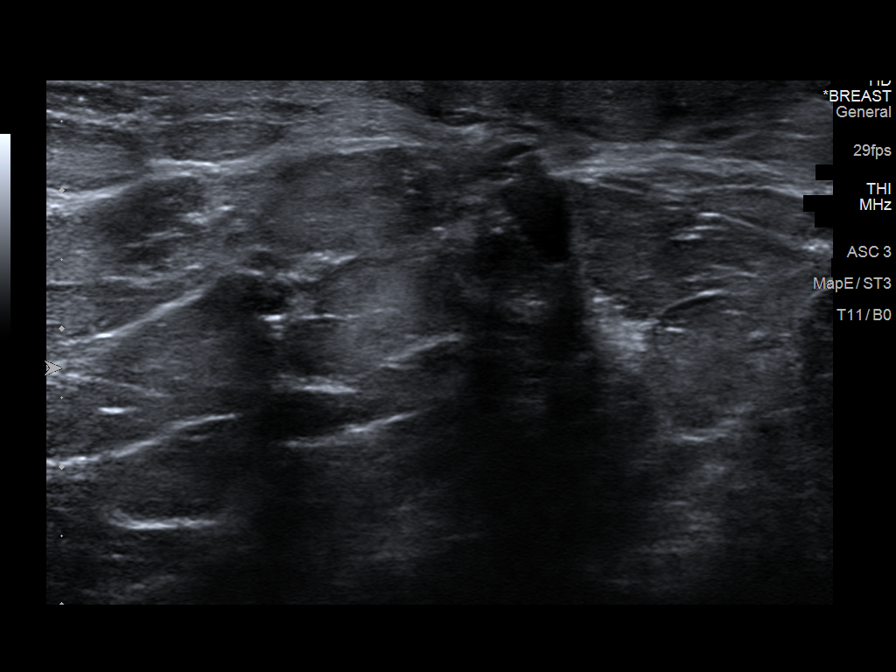

[8 of 8 positions shown; findings below may reference images not displayed]



Lesion quadrant: Upper inner quadrant

Using sterile technique and 1% Lidocaine as local anesthetic, under
direct ultrasound visualization, a 14 gauge Tanu device was
used to perform biopsy of a right breast mass at 2 o'clock using a
inferior approach. At the conclusion of the procedure a ribbon
tissue marker clip was deployed into the biopsy cavity. Follow up 2
view mammogram was performed and dictated separately.
IMPRESSION: Ultrasound guided biopsy of a right breast mass at 2 o'clock. No
apparent complications.

ADDENDUM:
Pathology revealed FIBROCYSTIC CHANGE, INCLUDING FIBROADENOMATOID
CHANGE of the RIGHT breast, 2 o'clock. This was found to be
concordant by Dr. Ignacia Matthew.

Pathology results were discussed with the patient by telephone. The
patient reported doing well after the biopsy with tenderness at the
site. Post biopsy instructions and care were reviewed and questions
were answered. The patient was encouraged to call The [REDACTED]

The patient was instructed to return for annual screening
mammography and informed a reminder notice would be sent regarding
this appointment.

Pathology results reported by Olivia Dauphin RN on 10/03/2019.



Lesion quadrant: Upper inner quadrant

Using sterile technique and 1% Lidocaine as local anesthetic, under
direct ultrasound visualization, a 14 gauge Tanu device was
used to perform biopsy of a right breast mass at 2 o'clock using a
inferior approach. At the conclusion of the procedure a ribbon
tissue marker clip was deployed into the biopsy cavity. Follow up 2
view mammogram was performed and dictated separately.
IMPRESSION: Ultrasound guided biopsy of a right breast mass at 2 o'clock. No
apparent complications.
